# Patient Record
Sex: Male | Born: 1965 | Race: Black or African American | Hispanic: No | Marital: Single | Smoking: Never smoker
Health system: Southern US, Community
[De-identification: ages and names within clinical notes are randomized; demographics above are authoritative.]

## PROBLEM LIST (undated history)

## (undated) DIAGNOSIS — R51 Headache: Secondary | ICD-10-CM

---

## 2000-01-31 HISTORY — PX: VASECTOMY: SHX75

## 2001-03-28 ENCOUNTER — Encounter: Payer: Self-pay | Admitting: Emergency Medicine

## 2001-03-28 ENCOUNTER — Emergency Department (HOSPITAL_COMMUNITY): Admission: EM | Admit: 2001-03-28 | Discharge: 2001-03-29 | Payer: Self-pay | Admitting: Emergency Medicine

## 2006-01-17 ENCOUNTER — Encounter: Admission: RE | Admit: 2006-01-17 | Discharge: 2006-01-17 | Payer: Self-pay | Admitting: Family Medicine

## 2006-02-14 ENCOUNTER — Encounter: Admission: RE | Admit: 2006-02-14 | Discharge: 2006-02-14 | Payer: Self-pay | Admitting: Family Medicine

## 2008-09-29 ENCOUNTER — Encounter (HOSPITAL_COMMUNITY): Admission: RE | Admit: 2008-09-29 | Discharge: 2008-12-04 | Payer: Self-pay | Admitting: Cardiology

## 2009-10-05 ENCOUNTER — Emergency Department (HOSPITAL_COMMUNITY): Admission: EM | Admit: 2009-10-05 | Discharge: 2009-10-05 | Payer: Self-pay | Admitting: Emergency Medicine

## 2009-11-18 ENCOUNTER — Encounter: Admission: RE | Admit: 2009-11-18 | Discharge: 2009-11-18 | Payer: Self-pay | Admitting: Occupational Medicine

## 2009-11-22 ENCOUNTER — Encounter: Payer: Self-pay | Admitting: Family Medicine

## 2009-11-23 ENCOUNTER — Ambulatory Visit: Payer: Self-pay | Admitting: Family Medicine

## 2009-11-23 DIAGNOSIS — M7710 Lateral epicondylitis, unspecified elbow: Secondary | ICD-10-CM | POA: Insufficient documentation

## 2009-11-23 DIAGNOSIS — G90519 Complex regional pain syndrome I of unspecified upper limb: Secondary | ICD-10-CM | POA: Insufficient documentation

## 2009-12-14 ENCOUNTER — Ambulatory Visit: Payer: Self-pay | Admitting: Family Medicine

## 2009-12-14 DIAGNOSIS — S5420XA Injury of radial nerve at forearm level, unspecified arm, initial encounter: Secondary | ICD-10-CM | POA: Insufficient documentation

## 2010-01-11 ENCOUNTER — Ambulatory Visit: Payer: Self-pay | Admitting: Family Medicine

## 2010-02-21 ENCOUNTER — Encounter: Payer: Self-pay | Admitting: Cardiology
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
## 2010-03-01 NOTE — Letter (Signed)
Summary: [facility] [place] [facility] [place]   Imported By: Marily Memos 11/22/2009 12:29:19  _____________________________________________________________________  External Attachment:    Type:   Image     Comment:   External Document
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
## 2012-02-28 ENCOUNTER — Ambulatory Visit
Admission: RE | Admit: 2012-02-28 | Discharge: 2012-02-28 | Disposition: A | Discharge status: Home / Self Care | Payer: 59 | Source: Ambulatory Visit | Attending: Physician Assistant | Admitting: Physician Assistant

## 2012-02-28 ENCOUNTER — Other Ambulatory Visit: Payer: Self-pay | Admitting: Physician Assistant

## 2012-02-28 DIAGNOSIS — R51 Headache: Secondary | ICD-10-CM

## 2013-01-01 ENCOUNTER — Ambulatory Visit (INDEPENDENT_AMBULATORY_CARE_PROVIDER_SITE_OTHER): Payer: 59 | Admitting: General Surgery

## 2013-01-01 ENCOUNTER — Encounter (INDEPENDENT_AMBULATORY_CARE_PROVIDER_SITE_OTHER): Payer: Self-pay

## 2013-01-01 ENCOUNTER — Encounter (INDEPENDENT_AMBULATORY_CARE_PROVIDER_SITE_OTHER): Payer: Self-pay | Admitting: General Surgery

## 2013-01-01 ENCOUNTER — Telehealth (INDEPENDENT_AMBULATORY_CARE_PROVIDER_SITE_OTHER): Payer: Self-pay | Admitting: General Surgery

## 2013-01-01 VITALS — BP 132/84 | HR 80 | Temp 97.2°F | Resp 16 | Ht 72.0 in | Wt 190.0 lb

## 2013-01-01 DIAGNOSIS — K4090 Unilateral inguinal hernia, without obstruction or gangrene, not specified as recurrent: Secondary | ICD-10-CM

## 2013-01-01 NOTE — Progress Notes (Signed)
Patient ID: Charles Roy, male   DOB: 23-Nov-1965, 47 y.o.   MRN: [id number]  Chief Complaint  Patient presents with  . Inguinal Hernia    left    HPI Charles Roy is a 47 y.o. male.  The patient is a 47 year old male who is referred for evaluation of a left inguinal hernia. Patient states it has been there for approximately 8-6 months. The pain area. He does do some heavy lifting while at work.  HPI  No past medical history on file.  Past Surgical History  Procedure Laterality Date  . Vasectomy  2002    No family history on file.  Social History History  Substance Use Topics  . Smoking status: Never Smoker   . Smokeless tobacco: Not on file  . Alcohol Use: No    No Known Allergies  Current Outpatient Prescriptions  Medication Sig Dispense Refill  . ibuprofen (ADVIL,MOTRIN) 800 MG tablet Take 800 mg by mouth every 8 (eight) hours as needed.       No current facility-administered medications for this visit.    Review of Systems Review of Systems  Constitutional: Negative.   HENT: Negative.   Respiratory: Negative.   Cardiovascular: Negative.   Gastrointestinal: Negative.   Neurological: Negative.   All other systems reviewed and are negative.    Blood pressure 132/84, pulse 80, temperature 97.2 F (36.2 C), temperature source Temporal, resp. rate 16, height 6' (1.829 m), weight 190 lb (86.183 kg).  Physical Exam Physical Exam  Constitutional: He is oriented to person, place, and time. He appears well-developed and well-nourished.  HENT:  Head: Normocephalic and atraumatic.  Eyes: Conjunctivae and EOM are normal. Pupils are equal, round, and reactive to light.  Neck: Normal range of motion. Neck supple.  Cardiovascular: Normal rate, regular rhythm and normal heart sounds.   Pulmonary/Chest: Effort normal and breath sounds normal.  Abdominal: Soft. Bowel sounds are normal. A hernia is present. Hernia confirmed positive in the left inguinal area.  Hernia confirmed negative in the right inguinal area (Right inguinal weakness, no overt hernia).  Musculoskeletal: Normal range of motion.  Neurological: He is alert and oriented to person, place, and time.  Skin: Skin is warm.    Data Reviewed none  Assessment    47 year old male with a left inguinal hernia and right inguinal weakness.     Plan    1. We'll proceed to the operating room for laparoscopic leftinguinal hernia repair. We'll also review the right inguinal area with possible repair with mesh. 2. All risks and benefits were discussed with the patient, to generally include infection, bleeding, damage to surrounding structures, acute and chronic nerve pain, and recurrence. Alternatives were offered and described.  All questions were answered and the patient voiced understanding of the procedure and wishes to proceed at this point.         Marigene Ehlers., Lashe Oliveira 01/01/2013, 2:06 PM

## 2013-01-01 NOTE — Telephone Encounter (Signed)
01/01/13 I met with patient and went over benefits and financial responsibility. He will call back when he is ready to schedule. Placed in pending. skm

## 2013-02-19 ENCOUNTER — Other Ambulatory Visit (INDEPENDENT_AMBULATORY_CARE_PROVIDER_SITE_OTHER): Payer: Self-pay | Admitting: General Surgery

## 2013-02-21 ENCOUNTER — Encounter (HOSPITAL_COMMUNITY): Payer: Self-pay | Admitting: Pharmacy Technician

## 2013-02-26 NOTE — Pre-Procedure Instructions (Signed)
Charles OdorDouglas B Roy  02/26/2013   Your procedure is scheduled on:  Friday, February 28, 2013 at 12:15 PM  [facility] (use Main Entrance "A') at 10:15 AM.  Call this number if you have problems the morning of surgery: [phone]   Remember:   Do not eat food or drink liquids after midnight.   Take these medicines the morning of surgery with A SIP OF WATER: None Stop taking Aspirin and herbal medications. Do not take any NSAIDs ie: Ibuprofen, Advil, Naproxen or any medication containing Aspirin.  Do not wear jewelry, make-up or nail polish.  Do not wear lotions, powders, or perfumes. You may wear deodorant.  Do not shave 48 hours prior to surgery. Men may shave face and neck.  Do not bring valuables to the hospital.  [facility] is not responsible for any belongings or valuables.               Contacts, dentures or bridgework may not be worn into surgery.  Leave suitcase in the car. After surgery it may be brought to your room.  For patients admitted to the hospital, discharge time is determined by your treatment team.               Patients discharged the day of surgery will not be allowed to drive home.  Name and phone number of your driver:   Special Instructions: Shower using CHG the night before surgery and the day of surgery.   Please read over the following fact sheets that you were given: Pain Booklet, Coughing and Deep Breathing and Surgical Site Infection Prevention

## 2013-02-27 ENCOUNTER — Encounter (HOSPITAL_COMMUNITY)
Admission: RE | Admit: 2013-02-27 | Discharge: 2013-02-27 | Disposition: A | Discharge status: Home / Self Care | Payer: 59 | Source: Ambulatory Visit | Attending: General Surgery | Admitting: General Surgery

## 2013-02-27 ENCOUNTER — Encounter (HOSPITAL_COMMUNITY): Payer: Self-pay

## 2013-02-27 HISTORY — DX: Headache: R51

## 2013-02-27 LAB — BASIC METABOLIC PANEL
BUN: 10 mg/dL (ref 6–23)
CHLORIDE: 103 meq/L (ref 96–112)
CO2: 28 mEq/L (ref 19–32)
Calcium: 9.3 mg/dL (ref 8.4–10.5)
Creatinine, Ser: 0.99 mg/dL (ref 0.50–1.35)
GLUCOSE: 98 mg/dL (ref 70–99)
POTASSIUM: 4.6 meq/L (ref 3.7–5.3)
SODIUM: 142 meq/L (ref 137–147)

## 2013-02-27 LAB — CBC
HEMATOCRIT: 42.5 % (ref 39.0–52.0)
HEMOGLOBIN: 14.7 g/dL (ref 13.0–17.0)
MCH: 30.6 pg (ref 26.0–34.0)
MCHC: 34.6 g/dL (ref 30.0–36.0)
MCV: 88.4 fL (ref 78.0–100.0)
Platelets: 217 10*3/uL (ref 150–400)
RBC: 4.81 MIL/uL (ref 4.22–5.81)
RDW: 12.6 % (ref 11.5–15.5)
WBC: 5.2 10*3/uL (ref 4.0–10.5)

## 2013-02-27 MED ORDER — CHLORHEXIDINE GLUCONATE 4 % EX LIQD: 1.0000 "application " | Freq: Once | CUTANEOUS | Status: DC

## 2013-02-27 MED ORDER — CEFAZOLIN SODIUM-DEXTROSE 2-3 GM-% IV SOLR
2.0000 g | INTRAVENOUS | Status: AC
Start: 2013-02-28 — End: 2013-02-28
  Administered 2013-02-28: 2 g via INTRAVENOUS
  Filled 2013-02-27: qty 50

## 2013-02-28 ENCOUNTER — Encounter (HOSPITAL_COMMUNITY): Payer: Self-pay | Admitting: *Deleted

## 2013-02-28 ENCOUNTER — Ambulatory Visit (HOSPITAL_COMMUNITY): Payer: 59 | Admitting: Certified Registered Nurse Anesthetist

## 2013-02-28 ENCOUNTER — Encounter (HOSPITAL_COMMUNITY): Payer: 59 | Admitting: Certified Registered Nurse Anesthetist

## 2013-02-28 ENCOUNTER — Ambulatory Visit (HOSPITAL_COMMUNITY)
Admission: RE | Admit: 2013-02-28 | Discharge: 2013-02-28 | Disposition: A | Discharge status: Home / Self Care | Payer: 59 | Source: Ambulatory Visit | Attending: General Surgery | Admitting: General Surgery

## 2013-02-28 ENCOUNTER — Encounter (HOSPITAL_COMMUNITY)
Admission: RE | Disposition: A | Discharge status: Home / Self Care | Payer: Self-pay | Source: Ambulatory Visit | Attending: General Surgery

## 2013-02-28 DIAGNOSIS — K4020 Bilateral inguinal hernia, without obstruction or gangrene, not specified as recurrent: Secondary | ICD-10-CM | POA: Insufficient documentation

## 2013-02-28 HISTORY — PX: INSERTION OF MESH: SHX5868

## 2013-02-28 HISTORY — PX: INGUINAL HERNIA REPAIR: SHX194

## 2013-02-28 SURGERY — REPAIR, HERNIA, INGUINAL, BILATERAL, LAPAROSCOPIC
Anesthesia: General | Site: Groin

## 2013-02-28 MED ORDER — FENTANYL CITRATE 0.05 MG/ML IJ SOLN
INTRAMUSCULAR | Status: DC | PRN
  Administered 2013-02-28: 100 ug via INTRAVENOUS

## 2013-02-28 MED ORDER — ONDANSETRON HCL 4 MG/2ML IJ SOLN: 4.0000 mg | Freq: Once | INTRAMUSCULAR | Status: DC | PRN

## 2013-02-28 MED ORDER — ACETAMINOPHEN 160 MG/5ML PO SOLN
325.0000 mg | ORAL | Status: DC | PRN
  Filled 2013-02-28: qty 20.3

## 2013-02-28 MED ORDER — SODIUM CHLORIDE 0.9 % IJ SOLN: 3.0000 mL | INTRAMUSCULAR | Status: DC | PRN

## 2013-02-28 MED ORDER — GLYCOPYRROLATE 0.2 MG/ML IJ SOLN
INTRAMUSCULAR | Status: DC | PRN
  Administered 2013-02-28: 0.6 mg via INTRAVENOUS

## 2013-02-28 MED ORDER — OXYCODONE HCL 5 MG/5ML PO SOLN: 5.0000 mg | Freq: Once | ORAL | Status: DC | PRN

## 2013-02-28 MED ORDER — PROPOFOL 10 MG/ML IV BOLUS
INTRAVENOUS | Status: DC | PRN
  Administered 2013-02-28: 160 mg via INTRAVENOUS
  Administered 2013-02-28: 40 mg via INTRAVENOUS

## 2013-02-28 MED ORDER — ARTIFICIAL TEARS OP OINT
TOPICAL_OINTMENT | OPHTHALMIC | Status: AC
  Filled 2013-02-28: qty 3.5

## 2013-02-28 MED ORDER — ACETAMINOPHEN 325 MG PO TABS: 325.0000 mg | ORAL_TABLET | ORAL | Status: DC | PRN

## 2013-02-28 MED ORDER — OXYCODONE HCL 5 MG PO TABS
ORAL_TABLET | ORAL | Status: AC
  Filled 2013-02-28: qty 1

## 2013-02-28 MED ORDER — OXYCODONE HCL 5 MG PO TABS: 5.0000 mg | ORAL_TABLET | Freq: Once | ORAL | Status: DC | PRN

## 2013-02-28 MED ORDER — BUPIVACAINE HCL (PF) 0.25 % IJ SOLN
INTRAMUSCULAR | Status: DC | PRN
  Administered 2013-02-28: 5 mL

## 2013-02-28 MED ORDER — 0.9 % SODIUM CHLORIDE (POUR BTL) OPTIME
TOPICAL | Status: DC | PRN
  Administered 2013-02-28: 1000 mL

## 2013-02-28 MED ORDER — NEOSTIGMINE METHYLSULFATE 1 MG/ML IJ SOLN
INTRAMUSCULAR | Status: DC | PRN
  Administered 2013-02-28: 3 mg via INTRAVENOUS

## 2013-02-28 MED ORDER — FENTANYL CITRATE 0.05 MG/ML IJ SOLN
INTRAMUSCULAR | Status: AC
  Filled 2013-02-28: qty 5

## 2013-02-28 MED ORDER — GLYCOPYRROLATE 0.2 MG/ML IJ SOLN
INTRAMUSCULAR | Status: AC
  Filled 2013-02-28: qty 3

## 2013-02-28 MED ORDER — HYDROMORPHONE HCL PF 1 MG/ML IJ SOLN
0.2500 mg | INTRAMUSCULAR | Status: DC | PRN
  Administered 2013-02-28 (×2): 0.25 mg via INTRAVENOUS
  Administered 2013-02-28: 0.5 mg via INTRAVENOUS

## 2013-02-28 MED ORDER — LIDOCAINE HCL (CARDIAC) 20 MG/ML IV SOLN
INTRAVENOUS | Status: AC
  Filled 2013-02-28: qty 10

## 2013-02-28 MED ORDER — LIDOCAINE HCL (CARDIAC) 20 MG/ML IV SOLN
INTRAVENOUS | Status: DC | PRN
Start: 2013-02-28 — End: 2013-02-28
  Administered 2013-02-28: 80 mg via INTRAVENOUS

## 2013-02-28 MED ORDER — MIDAZOLAM HCL 2 MG/2ML IJ SOLN
INTRAMUSCULAR | Status: AC
  Filled 2013-02-28: qty 2

## 2013-02-28 MED ORDER — LACTATED RINGERS IV SOLN
INTRAVENOUS | Status: DC | PRN
  Administered 2013-02-28 (×2): via INTRAVENOUS

## 2013-02-28 MED ORDER — ACETAMINOPHEN 650 MG RE SUPP
650.0000 mg | RECTAL | Status: DC | PRN
  Filled 2013-02-28: qty 1

## 2013-02-28 MED ORDER — LACTATED RINGERS IV SOLN
Freq: Once | INTRAVENOUS | Status: AC
  Administered 2013-02-28: 11:00:00 via INTRAVENOUS

## 2013-02-28 MED ORDER — OXYCODONE-ACETAMINOPHEN 10-325 MG PO TABS: 1.0000 | ORAL_TABLET | ORAL | Status: AC | PRN

## 2013-02-28 MED ORDER — OXYCODONE HCL 5 MG PO TABS
5.0000 mg | ORAL_TABLET | ORAL | Status: DC | PRN
  Administered 2013-02-28: 5 mg via ORAL

## 2013-02-28 MED ORDER — HYDROMORPHONE HCL PF 1 MG/ML IJ SOLN
INTRAMUSCULAR | Status: AC
  Filled 2013-02-28: qty 1

## 2013-02-28 MED ORDER — ROCURONIUM BROMIDE 100 MG/10ML IV SOLN
INTRAVENOUS | Status: DC | PRN
  Administered 2013-02-28: 50 mg via INTRAVENOUS

## 2013-02-28 MED ORDER — SODIUM CHLORIDE 0.9 % IV SOLN
250.0000 mL | INTRAVENOUS | Status: DC | PRN
Start: 2013-02-28 — End: 2013-02-28

## 2013-02-28 MED ORDER — SODIUM CHLORIDE 0.9 % IJ SOLN: 3.0000 mL | Freq: Two times a day (BID) | INTRAMUSCULAR | Status: DC

## 2013-02-28 MED ORDER — PROPOFOL 10 MG/ML IV BOLUS
INTRAVENOUS | Status: AC
  Filled 2013-02-28: qty 20

## 2013-02-28 MED ORDER — ONDANSETRON HCL 4 MG/2ML IJ SOLN
4.0000 mg | Freq: Four times a day (QID) | INTRAMUSCULAR | Status: DC | PRN
  Filled 2013-02-28: qty 2

## 2013-02-28 MED ORDER — ACETAMINOPHEN 325 MG PO TABS
650.0000 mg | ORAL_TABLET | ORAL | Status: DC | PRN
  Filled 2013-02-28: qty 2

## 2013-02-28 MED ORDER — MIDAZOLAM HCL 5 MG/5ML IJ SOLN
INTRAMUSCULAR | Status: DC | PRN
  Administered 2013-02-28: 2 mg via INTRAVENOUS

## 2013-02-28 MED ORDER — ONDANSETRON HCL 4 MG/2ML IJ SOLN
INTRAMUSCULAR | Status: DC | PRN
  Administered 2013-02-28: 4 mg via INTRAVENOUS

## 2013-02-28 SURGICAL SUPPLY — 54 items
APL SKNCLS STERI-STRIP NONHPOA (GAUZE/BANDAGES/DRESSINGS) ×2
APPLIER CLIP LOGIC TI 5 (MISCELLANEOUS) IMPLANT
APR CLP MED LRG 33X5 (MISCELLANEOUS)
BENZOIN TINCTURE PRP APPL 2/3 (GAUZE/BANDAGES/DRESSINGS) ×3 IMPLANT
CANISTER SUCTION 2500CC (MISCELLANEOUS) IMPLANT
CHLORAPREP W/TINT 26ML (MISCELLANEOUS) ×3 IMPLANT
COVER SURGICAL LIGHT HANDLE (MISCELLANEOUS) ×3 IMPLANT
DEVICE TROCAR PUNCTURE CLOSURE (ENDOMECHANICALS) ×3 IMPLANT
DISSECT BALLN SPACEMKR + OVL (BALLOONS)
DISSECTOR BALLN SPACEMKR + OVL (BALLOONS) ×2 IMPLANT
DISSECTOR BLUNT TIP ENDO 5MM (MISCELLANEOUS) IMPLANT
DRAPE UTILITY 15X26 W/TAPE STR (DRAPE) ×6 IMPLANT
ELECT REM PT RETURN 9FT ADLT (ELECTROSURGICAL) ×3
ELECTRODE REM PT RTRN 9FT ADLT (ELECTROSURGICAL) ×2 IMPLANT
GAUZE SPONGE 2X2 8PLY STRL LF (GAUZE/BANDAGES/DRESSINGS) ×2 IMPLANT
GLOVE BIO SURGEON STRL SZ7.5 (GLOVE) ×3 IMPLANT
GLOVE BIOGEL PI IND STRL 6.5 (GLOVE) IMPLANT
GLOVE BIOGEL PI IND STRL 7.0 (GLOVE) IMPLANT
GLOVE BIOGEL PI INDICATOR 6.5 (GLOVE) ×2
GLOVE BIOGEL PI INDICATOR 7.0 (GLOVE) ×2
GLOVE SURG SS PI 7.0 STRL IVOR (GLOVE) ×2 IMPLANT
GOWN STRL NON-REIN LRG LVL3 (GOWN DISPOSABLE) ×6 IMPLANT
GOWN STRL REIN XL XLG (GOWN DISPOSABLE) ×3 IMPLANT
KIT BASIN OR (CUSTOM PROCEDURE TRAY) ×3 IMPLANT
KIT ROOM TURNOVER OR (KITS) ×3 IMPLANT
MESH 3DMAX LIGHT 4.1X6.2 LT LR (Mesh General) ×1 IMPLANT
MESH 3DMAX LIGHT 4.1X6.2 RT LR (Mesh General) ×1 IMPLANT
NDL INSUFFLATION 14GA 120MM (NEEDLE) ×2 IMPLANT
NEEDLE INSUFFLATION 14GA 120MM (NEEDLE) ×3 IMPLANT
NS IRRIG 1000ML POUR BTL (IV SOLUTION) ×3 IMPLANT
PAD ARMBOARD 7.5X6 YLW CONV (MISCELLANEOUS) ×6 IMPLANT
RELOAD STAPLE 4.0 BLU F/HERNIA (INSTRUMENTS) IMPLANT
RELOAD STAPLE 4.8 BLK F/HERNIA (STAPLE) IMPLANT
RELOAD STAPLE HERNIA 4.0 BLUE (INSTRUMENTS) ×3 IMPLANT
RELOAD STAPLE HERNIA 4.8 BLK (STAPLE) IMPLANT
SCISSORS LAP 5X35 DISP (ENDOMECHANICALS) ×3 IMPLANT
SET IRRIG TUBING LAPAROSCOPIC (IRRIGATION / IRRIGATOR) IMPLANT
SET TROCAR LAP APPLE-HUNT 5MM (ENDOMECHANICALS) ×1 IMPLANT
SLEEVE ENDOPATH XCEL 5M (ENDOMECHANICALS) ×2 IMPLANT
SPONGE GAUZE 2X2 STER 10/PKG (GAUZE/BANDAGES/DRESSINGS) ×1
STAPLER HERNIA 12 8.5 360D (INSTRUMENTS) ×3 IMPLANT
STRIP CLOSURE SKIN 1/2X4 (GAUZE/BANDAGES/DRESSINGS) ×1 IMPLANT
SUT MNCRL AB 4-0 PS2 18 (SUTURE) ×3 IMPLANT
SUT VIC AB 1 CT1 27 (SUTURE)
SUT VIC AB 1 CT1 27XBRD ANBCTR (SUTURE) ×2 IMPLANT
TAPE CLOTH SURG 4X10 WHT LF (GAUZE/BANDAGES/DRESSINGS) ×1 IMPLANT
TOWEL OR 17X24 6PK STRL BLUE (TOWEL DISPOSABLE) ×3 IMPLANT
TOWEL OR 17X26 10 PK STRL BLUE (TOWEL DISPOSABLE) ×3 IMPLANT
TRAY FOLEY CATH 14FR (SET/KITS/TRAYS/PACK) ×1 IMPLANT
TRAY FOLEY CATH 16FRSI W/METER (SET/KITS/TRAYS/PACK) ×2 IMPLANT
TRAY LAPAROSCOPIC (CUSTOM PROCEDURE TRAY) ×3 IMPLANT
TROCAR XCEL 12X100 BLDLESS (ENDOMECHANICALS) ×1 IMPLANT
TROCAR XCEL NON-BLD 11X100MML (ENDOMECHANICALS) ×2 IMPLANT
TROCAR XCEL NON-BLD 5MMX100MML (ENDOMECHANICALS) ×2 IMPLANT

## 2013-02-28 NOTE — Op Note (Signed)
02/28/2013  2:32 PM  PATIENT:  Charles Roy  48 y.o. male  PRE-OPERATIVE DIAGNOSIS:  BILATERAL INGUINAL HERNIA  POST-OPERATIVE DIAGNOSIS:  BILATERAL INGUINAL HERNIA  PROCEDURE:  Procedure(s): LAPAROSCOPIC BILATERAL INGUINAL HERNIA REPAIR (N/A) INSERTION OF MESH (Bilateral)  SURGEON:  Surgeon(s) and Role:    * Axel FillerArmando Annielee Jemmott, MD - Primary  ASSISTANTS: none   ANESTHESIA:   general  EBL:  Total I/O In: 1000 [I.V.:1000] Out: -   BLOOD ADMINISTERED:none  DRAINS: none   LOCAL MEDICATIONS USED:  MARCAINE     SPECIMEN:  No Specimen  DISPOSITION OF SPECIMEN:  N/A  COUNTS:  YES  TOURNIQUET:  * No tourniquets in log *  DICTATION: .Dragon Dictation   Indications for procedure:  The patient is a 48 year old male with a left inguinal hernia for several months. Patient complained of symptomatology to his left inguinal area.  He also had some inguinal floor weakness on the right. The patient was taken back for elective inguinal hernia repair.  Details of the procedure: The patient was taken back to the operating room. The patient was placed in supine position with bilateral SCDs in place. After appropriate anitbiotics were confirmed, a time-out was confirmed and all facts were verified.  0.25% Marcaine was used to infiltrate the umbilical area. He was used to cut down the skin and blunt dissection was used to get the anterior fashion.  The anterior fascia was incised approximately 1 cm and the muscles were divided anteriorly. Blunt dissection was then used to create a space in the preperitoneal area. At this time a 10 mm camera was then introduced into the space and advanced the pubic tubercle and a 12 mm trocar was placed over this and insufflation was started.  At this time and space was created from medial to laterally the preperitoneal space. The Left hernia sac was identified in the indirect space. Dissection of the hernia sac was undertaken the vas deferens was identified and  protected in all parts of the case.   Once the hernia sac was taken down to approximately the umbilicus a Bard 3D light left mesh was introduced into the preperitoneal space.  The mesh was brought over the hernia space defect and anchored into place and secured to Cooper's ligament with 4.510mm staples from a Coviden hernia stapler. It was anchored to the anterior abdominal wall with 4.8 mm staples. The hernia sac was seen lying anterior to the mesh. There was no staples placed laterally.   I proceeded to dissect the right side inguinal area.  I made a hole in the right sided peritoneum.   A Veress needle right upper quadrant to help evacuate the intraperitoneal air.  I proceeded to dissect out the right testicular vessels and hernia sac on the right.  The vas was seen and protected.  The exact dissection of the right side preperitoneal space took place.  A piece of Left 3D max light mesh was then placed and secured in the exact same fashion using the Covidien hernia stapler.   The insufflation was evacuated. The trochars were removed. The anterior fascia was reapproximated using #1 Vicryl on a UR- 6.  Intra-abdominal air was evacuated and the Veress needle removed. The skin was reapproximated using 4-0 Monocryl subcuticular fashion the patient was awakened from general anesthesia and taken to recovery in stable condition.   PLAN OF CARE: Discharge to home after PACU  PATIENT DISPOSITION:  PACU - hemodynamically stable.   Delay start of Pharmacological VTE agent (>24hrs)  due to surgical blood loss or risk of bleeding: not applicable

## 2013-02-28 NOTE — Transfer of Care (Signed)
Immediate Anesthesia Transfer of Care Note  Patient: Charles Roy  Procedure(s) Performed: Procedure(s): LAPAROSCOPIC BILATERAL INGUINAL HERNIA REPAIR (N/A) INSERTION OF MESH (Bilateral)  Patient Location: PACU  Anesthesia Type:General  Level of Consciousness: awake, alert , oriented and patient cooperative  Airway & Oxygen Therapy: Patient Spontanous Breathing and Patient connected to nasal cannula oxygen  Post-op Assessment: Report given to PACU RN, Post -op Vital signs reviewed and stable and Patient moving all extremities X 4  Post vital signs: Reviewed and stable  Complications: No apparent anesthesia complications

## 2013-02-28 NOTE — H&P (Signed)
  HPI  Charles Roy is a 48 y.o. male. The patient is a 48 year old male who is referred for evaluation of a left inguinal hernia. Patient states it has been there for approximately 8-6 months. The pain area. He does do some heavy lifting while at work.  HPI  No past medical history on file.  Past Surgical History   Procedure  Laterality  Date   .  Vasectomy   2002   No family history on file.  Social History  History   Substance Use Topics   .  Smoking status:  Never Smoker   .  Smokeless tobacco:  Not on file   .  Alcohol Use:  No   No Known Allergies  Current Outpatient Prescriptions   Medication  Sig  Dispense  Refill   .  ibuprofen (ADVIL,MOTRIN) 800 MG tablet  Take 800 mg by mouth every 8 (eight) hours as needed.      No current facility-administered medications for this visit.   Review of Systems  Review of Systems  Constitutional: Negative.  HENT: Negative.  Respiratory: Negative.  Cardiovascular: Negative.  Gastrointestinal: Negative.  Neurological: Negative.  All other systems reviewed and are negative.  Blood pressure 132/84, pulse 80, temperature 97.2 F (36.2 C), temperature source Temporal, resp. rate 16, height 6' (1.829 m), weight 190 lb (86.183 kg).  Physical Exam  Physical Exam  Constitutional: He is oriented to person, place, and time. He appears well-developed and well-nourished.  HENT:  Head: Normocephalic and atraumatic.  Eyes: Conjunctivae and EOM are normal. Pupils are equal, round, and reactive to light.  Neck: Normal range of motion. Neck supple.  Cardiovascular: Normal rate, regular rhythm and normal heart sounds.  Pulmonary/Chest: Effort normal and breath sounds normal.  Abdominal: Soft. Bowel sounds are normal. A hernia is present. Hernia confirmed positive in the left inguinal area. Hernia confirmed negative in the right inguinal area (Right inguinal weakness, no overt hernia).  Musculoskeletal: Normal range of motion.  Neurological: He is  alert and oriented to person, place, and time.  Skin: Skin is warm.  Data Reviewed  none  Assessment  48 year old male with a left inguinal hernia and right inguinal weakness.  Plan  1. We'll proceed to the operating room for laparoscopic leftinguinal hernia repair. We'll also review the right inguinal area with possible repair with mesh.  2. All risks and benefits were discussed with the patient, to generally include infection, bleeding, damage to surrounding structures, acute and chronic nerve pain, and recurrence. Alternatives were offered and described. All questions were answered and the patient voiced understanding of the procedure and wishes to proceed at this point.

## 2013-02-28 NOTE — Preoperative (Signed)
Beta Blockers   Reason not to administer Beta Blockers:Not Applicable 

## 2013-02-28 NOTE — Anesthesia Procedure Notes (Signed)
Procedure Name: Intubation Date/Time: 02/28/2013 12:59 PM Performed by: De NurseENNIE, Jazzlyn Huizenga E Pre-anesthesia Checklist: Patient identified, Emergency Drugs available, Suction available, Patient being monitored and Timeout performed Patient Re-evaluated:Patient Re-evaluated prior to inductionOxygen Delivery Method: Circle system utilized Preoxygenation: Pre-oxygenation with 100% oxygen Intubation Type: IV induction Ventilation: Mask ventilation without difficulty Laryngoscope Size: Mac and 4 Grade View: Grade II Tube type: Oral Tube size: 7.5 mm Number of attempts: 1 Airway Equipment and Method: Stylet Placement Confirmation: ETT inserted through vocal cords under direct vision,  positive ETCO2 and breath sounds checked- equal and bilateral Secured at: 21 cm Tube secured with: Tape Dental Injury: Teeth and Oropharynx as per pre-operative assessment

## 2013-02-28 NOTE — Discharge Instructions (Signed)
Inguinal Hernia, Adult  Care After Refer to this sheet in the next few weeks. These discharge instructions provide you with general information on caring for yourself after you leave the hospital. Your caregiver may also give you specific instructions. Your treatment has been planned according to the most current medical practices available, but unavoidable complications sometimes occur. If you have any problems or questions after discharge, please call your caregiver. HOME CARE INSTRUCTIONS  Put ice on the operative site.  Put ice in a plastic bag.  Place a towel between your skin and the bag.  Leave the ice on for 15-20 minutes at a time, 03-04 times a day while awake.  Change bandages (dressings) as directed.  Keep the wound dry and clean. The wound may be washed gently with soap and water. Gently blot or dab the wound dry. It is okay to take showers 24 to 48 hours after surgery. Do not take baths, use swimming pools, or use hot tubs for 10 days, or as directed by your caregiver.  Only take over-the-counter or prescription medicines for pain, discomfort, or fever as directed by your caregiver.  Continue your normal diet as directed.  Do not lift anything more than 10 pounds or play contact sports for 3 weeks, or as directed. SEEK MEDICAL CARE IF:  There is redness, swelling, or increasing pain in the wound.  There is fluid (pus) coming from the wound.  There is drainage from a wound lasting longer than 1 day.  You have an oral temperature above 102 F (38.9 C).  You notice a bad smell coming from the wound or dressing.  The wound breaks open after the stitches (sutures) have been removed.  You notice increasing pain in the shoulders (shoulder strap areas).  You develop dizzy episodes or fainting while standing.  You feel sick to your stomach (nauseous) or throw up (vomit). SEEK IMMEDIATE MEDICAL CARE IF:  You develop a rash.  You have difficulty breathing.  You  develop a reaction or have side effects to medicines you were given. MAKE SURE YOU:   Understand these instructions.  Will watch your condition.  Will get help right away if you are not doing well or get worse. Document Released: 02/16/2006 Document Revised: 04/10/2011 Document Reviewed: 12/16/2008 [facility] Patient Information 2014 [place], [place]   What to eat:  For your first meals, you should eat lightly; only small meals initially.  If you do not have nausea, you may eat larger meals.  Avoid spicy, greasy and heavy food.    General Anesthesia, Adult, Care After  Refer to this sheet in the next few weeks. These instructions provide you with information on caring for yourself after your procedure. Your health care provider may also give you more specific instructions. Your treatment has been planned according to current medical practices, but problems sometimes occur. Call your health care provider if you have any problems or questions after your procedure.  WHAT TO EXPECT AFTER THE PROCEDURE  After the procedure, it is typical to experience:  Sleepiness.  Nausea and vomiting. HOME CARE INSTRUCTIONS  For the first 24 hours after general anesthesia:  Have a responsible person with you.  Do not drive a car. If you are alone, do not take public transportation.  Do not drink alcohol.  Do not take medicine that has not been prescribed by your health care provider.  Do not sign important papers or make important decisions.  You may resume a normal diet and activities as  directed by your health care provider.  Change bandages (dressings) as directed.  If you have questions or problems that seem related to general anesthesia, call the hospital and ask for the anesthetist or anesthesiologist on call. SEEK MEDICAL CARE IF:  You have nausea and vomiting that continue the day after anesthesia.  You develop a rash. SEEK IMMEDIATE MEDICAL CARE IF:  You have difficulty breathing.  You have  chest pain.  You have any allergic problems. Document Released: 04/24/2000 Document Revised: 09/18/2012 Document Reviewed: 08/01/2012  [facility] Patient Information 2014 [place], [place].

## 2013-02-28 NOTE — Anesthesia Postprocedure Evaluation (Signed)
Anesthesia Post Note  Patient: Charles Roy  Procedure(s) Performed: Procedure(s) (LRB): LAPAROSCOPIC BILATERAL INGUINAL HERNIA REPAIR (N/A) INSERTION OF MESH (Bilateral)  Anesthesia type: general  Patient location: PACU  Post pain: Pain level controlled  Post assessment: Patient's Cardiovascular Status Stable  Last Vitals:  Filed Vitals:   02/28/13 1600  BP: 130/69  Pulse: 98  Temp:   Resp: 21    Post vital signs: Reviewed and stable  Level of consciousness: sedated  Complications: No apparent anesthesia complications
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
## 2013-03-01 ENCOUNTER — Telehealth (INDEPENDENT_AMBULATORY_CARE_PROVIDER_SITE_OTHER): Payer: Self-pay | Admitting: General Surgery

## 2013-03-01 MED ORDER — IBUPROFEN 800 MG PO TABS: 800.0000 mg | ORAL_TABLET | Freq: Three times a day (TID) | ORAL | Status: AC | PRN

## 2013-03-01 MED ORDER — PROMETHAZINE HCL 12.5 MG PO TABS: ORAL_TABLET | ORAL | Status: AC

## 2013-03-01 NOTE — Telephone Encounter (Signed)
Pt having nausea and vomiting with percocet.  Sending in phenergan to pharmacy and ibuprofen.

## 2013-03-01 NOTE — Telephone Encounter (Signed)
Received message to call back.   Got message "number not available."

## 2013-03-03 ENCOUNTER — Encounter (HOSPITAL_COMMUNITY): Payer: Self-pay | Admitting: General Surgery

## 2013-03-20 ENCOUNTER — Encounter (INDEPENDENT_AMBULATORY_CARE_PROVIDER_SITE_OTHER): Payer: Self-pay | Admitting: General Surgery

## 2013-03-20 ENCOUNTER — Ambulatory Visit (INDEPENDENT_AMBULATORY_CARE_PROVIDER_SITE_OTHER): Payer: 59 | Admitting: General Surgery

## 2013-03-20 VITALS — BP 122/68 | HR 70 | Resp 16 | Ht 72.0 in | Wt 192.0 lb

## 2013-03-20 DIAGNOSIS — Z9889 Other specified postprocedural states: Secondary | ICD-10-CM

## 2013-03-20 NOTE — Progress Notes (Signed)
Patient ID: Charles Roy, male   DOB: 14-Nov-1965, 48 y.o.   MRN: [id number] Post op course The patient is a 48 year old male status post laparoscopic bilateral inguinal hernia repair with mesh. Patient had some pain to his left inguinal area superior abdominal wall. He states a sharp pain. His ongoing approximately 4 or 5 days ago. Prior to this using the issues. The patient's had no issues with his right inguinal hernia repair or solution the incision sites.  On Exam: Is midline wounds are clean dry and intact there is no hernia on palpation on the left or right. He does have a small palpable knot on his left abdominal wall.   Assessment and Plan 48 year old male status post laparoscopic bilateral inguinal hernia repair with mesh. 1. We'll have the patient follow back up in 2 weeks 2. Discussed with the patient a heavy lifting for another 3 weeks. 3. Because the patient would likely be beneficial to take ibuprofen help with some of the pain is having.   Charles FillerArmando Roel Douthat, MD [facility] General & Minimally Invasive Surgery Trauma & Emergency Surgery

## 2013-04-17 ENCOUNTER — Encounter (INDEPENDENT_AMBULATORY_CARE_PROVIDER_SITE_OTHER): Payer: Self-pay | Admitting: General Surgery

## 2013-04-17 ENCOUNTER — Encounter (INDEPENDENT_AMBULATORY_CARE_PROVIDER_SITE_OTHER): Payer: Self-pay

## 2013-04-17 ENCOUNTER — Ambulatory Visit (INDEPENDENT_AMBULATORY_CARE_PROVIDER_SITE_OTHER): Payer: 59 | Admitting: General Surgery

## 2013-04-17 VITALS — BP 140/78 | HR 75 | Temp 97.8°F | Resp 16 | Ht 72.0 in | Wt 197.0 lb

## 2013-04-17 DIAGNOSIS — Z9889 Other specified postprocedural states: Secondary | ICD-10-CM

## 2013-04-17 NOTE — Progress Notes (Signed)
Patient ID: Charles Roy, male   DOB: 1965-08-01, 48 y.o.   MRN: [id number] Post op course The patient has been doing well since his last clinic visit. He states that he not he felt his left abdominal wall area has resolved. He feels some minor stretching his midline however is getting better. The patient otherwise has no further complaints  On Exam: Wounds are clean dry and intact, no hernia on palpation   Assessment and Plan 48 year old male status post laparoscopic bilateral inguinal hernia repairwith mesh 1. The patient returned to work, no heavy lifting greater then 50 pounds months. 2. The patient followup as needed   Charles FillerArmando Hansini Clodfelter, MD [facility] General & Minimally Invasive Surgery Trauma & Emergency Surgery

## 2013-07-07 ENCOUNTER — Telehealth (INDEPENDENT_AMBULATORY_CARE_PROVIDER_SITE_OTHER): Payer: Self-pay

## 2013-07-07 NOTE — Telephone Encounter (Signed)
Patient is requesting a letter for daughter who is the [employer] stating she was LOA from 02/28/13 to 04/17/13 to take care of her dad after surgery. Advised him I would need to get it cleared thru  DR. Derrell Lolling and I or Neysa Bonito would call him to let him know of his response.

## 2013-07-08 NOTE — Telephone Encounter (Signed)
I would think that 2weeks max would account for care after surgery.

## 2013-07-10 ENCOUNTER — Encounter (INDEPENDENT_AMBULATORY_CARE_PROVIDER_SITE_OTHER): Payer: Self-pay

## 2013-07-10 NOTE — Telephone Encounter (Signed)
Called and spoke to patient regarding note for daughter while patient was recovery from surgery.  Note written, signed by Dr. Derrell Lolling and mailed to patient per his request.

## 2014-05-30 ENCOUNTER — Emergency Department (HOSPITAL_COMMUNITY)
Admission: EM | Admit: 2014-05-30 | Discharge: 2014-05-30 | Disposition: A | Discharge status: Home / Self Care | Payer: 59 | Attending: Emergency Medicine | Admitting: Emergency Medicine

## 2014-05-30 ENCOUNTER — Encounter (HOSPITAL_COMMUNITY): Payer: Self-pay

## 2014-05-30 DIAGNOSIS — Y998 Other external cause status: Secondary | ICD-10-CM | POA: Diagnosis not present

## 2014-05-30 DIAGNOSIS — Y9389 Activity, other specified: Secondary | ICD-10-CM | POA: Insufficient documentation

## 2014-05-30 DIAGNOSIS — Y9289 Other specified places as the place of occurrence of the external cause: Secondary | ICD-10-CM | POA: Insufficient documentation

## 2014-05-30 DIAGNOSIS — Z8679 Personal history of other diseases of the circulatory system: Secondary | ICD-10-CM | POA: Insufficient documentation

## 2014-05-30 DIAGNOSIS — S60352A Superficial foreign body of left thumb, initial encounter: Secondary | ICD-10-CM | POA: Diagnosis present

## 2014-05-30 DIAGNOSIS — X58XXXA Exposure to other specified factors, initial encounter: Secondary | ICD-10-CM | POA: Insufficient documentation

## 2014-05-30 DIAGNOSIS — Z23 Encounter for immunization: Secondary | ICD-10-CM | POA: Diagnosis not present

## 2014-05-30 DIAGNOSIS — M795 Residual foreign body in soft tissue: Secondary | ICD-10-CM

## 2014-05-30 MED ORDER — TETANUS-DIPHTH-ACELL PERTUSSIS 5-2.5-18.5 LF-MCG/0.5 IM SUSP
0.5000 mL | Freq: Once | INTRAMUSCULAR | Status: AC
  Administered 2014-05-30: 0.5 mL via INTRAMUSCULAR
  Filled 2014-05-30: qty 0.5

## 2014-05-30 MED ORDER — LIDOCAINE HCL (PF) 1 % IJ SOLN
2.0000 mL | Freq: Once | INTRAMUSCULAR | Status: DC
  Filled 2014-05-30: qty 5

## 2014-05-30 NOTE — Discharge Instructions (Signed)
Watch for signs of infection

## 2014-05-30 NOTE — ED Notes (Signed)
Pt presents with c/o foreign object in his thumb. Pt has a fishing lure stuck in his thumb, past the barb.

## 2014-05-30 NOTE — ED Provider Notes (Signed)
CSN: [id number]     Arrival date & time 05/30/14  2029 History  This chart was scribed for Earley FavorGail Latona Krichbaum, NP working with Azalia BilisKevin Campos, MD by Evon Slackerrance Branch, ED Scribe. This patient was seen in room WTR2/WLPT2 and the patient's care was started at 8:40 PM.     Chief Complaint  Patient presents with  . Foreign Body in Skin   The history is provided by the patient. No language interpreter was used.   HPI Comments: Charles Roy is a 49 y.o. male who presents to the Emergency Department complaining of new foreign body in the left thumb. Pt states that he has a fishing lure in his thumb. Pt states that he tried to remove the lure with no relief. Pt denies numbness or tingling. Pt sates that he is unsure if his tetanus is UTD.   Past Medical History  Diagnosis Date  . Headache(784.0)     migraines 1 years ago   Past Surgical History  Procedure Laterality Date  . Vasectomy  2002  . Inguinal hernia repair N/A 02/28/2013    Procedure: LAPAROSCOPIC BILATERAL INGUINAL HERNIA REPAIR;  Surgeon: Axel FillerArmando Ramirez, MD;  Location: [facility] OR;  Service: General;  Laterality: N/A;  . Insertion of mesh Bilateral 02/28/2013    Procedure: INSERTION OF MESH;  Surgeon: Axel FillerArmando Ramirez, MD;  Location: [facility] OR;  Service: General;  Laterality: Bilateral;   No family history on file. History  Substance Use Topics  . Smoking status: Never Smoker   . Smokeless tobacco: Never Used  . Alcohol Use: No    Review of Systems  Skin: Positive for wound.  All other systems reviewed and are negative.     Allergies  Pork-derived products  Home Medications   Prior to Admission medications   Medication Sig Start Date End Date Taking? Authorizing Provider  ibuprofen (ADVIL,MOTRIN) 800 MG tablet Take 1 tablet (800 mg total) by mouth every 8 (eight) hours as needed for moderate pain. 03/01/13   Almond LintFaera Byerly, MD  oxyCODONE-acetaminophen (PERCOCET) 10-325 MG per tablet Take 1 tablet by mouth every 4 (four) hours as needed  for pain. 02/28/13   Axel FillerArmando Ramirez, MD  promethazine (PHENERGAN) 12.5 MG tablet Take 1-2 tabs po q 6 hours as needed for n/v 03/01/13   Almond LintFaera Byerly, MD   BP 152/94 mmHg  Pulse 66  Temp(Src) 98 F (36.7 C) (Oral)  Resp 18  SpO2 100%   Physical Exam  Constitutional: He is oriented to person, place, and time. He appears well-developed and well-nourished. No distress.  HENT:  Head: Normocephalic and atraumatic.  Eyes: Conjunctivae and EOM are normal.  Neck: Neck supple. No tracheal deviation present.  Cardiovascular: Normal rate.   Pulmonary/Chest: Effort normal. No respiratory distress.  Musculoskeletal: Normal range of motion.  Neurological: He is alert and oriented to person, place, and time.  Skin: Skin is warm and dry.  Psychiatric: He has a normal mood and affect. His behavior is normal.  Nursing note and vitals reviewed.   ED Course  FOREIGN BODY REMOVAL Date/Time: 05/30/2014 9:37 PM Performed by: Earley FavorSCHULZ, Julianah Marciel Authorized by: Earley FavorSCHULZ, Khari Lett Consent: Written consent not obtained. Consent given by: patient Patient understanding: patient states understanding of the procedure being performed Patient identity confirmed: verbally with patient Body area: skin General location: upper extremity Location details: left index finger Anesthesia: local infiltration Local anesthetic: lidocaine 1% without epinephrine Anesthetic total: 0.2 ml Patient sedated: no Patient restrained: no Patient cooperative: yes Localization method: visualized Removal mechanism: forceps Tendon  involvement: none Depth: subcutaneous Complexity: simple 1 objects recovered. Objects recovered: fish hook Post-procedure assessment: foreign body removed Patient tolerance: Patient tolerated the procedure well with no immediate complications Comments: Finger soaking in Betadine solution    (including critical care time) DIAGNOSTIC STUDIES: Oxygen Saturation is 100% on RA, normal by my interpretation.     COORDINATION OF CARE: 8:43 PM-Discussed treatment plan with pt at bedside and pt agreed to plan.     Labs Review Labs Reviewed - No data to display  Imaging Review No results found.   EKG Interpretation None      MDM   Final diagnoses:  Foreign body (FB) in soft tissue           Earley Favor, NP 05/30/14 2139  Azalia Bilis, MD 05/30/14 [phone]

## 2015-07-07 ENCOUNTER — Other Ambulatory Visit: Payer: Self-pay | Admitting: Physician Assistant

## 2015-07-07 DIAGNOSIS — R1011 Right upper quadrant pain: Secondary | ICD-10-CM

## 2015-07-15 ENCOUNTER — Ambulatory Visit
Admission: RE | Admit: 2015-07-15 | Discharge: 2015-07-15 | Disposition: A | Discharge status: Home / Self Care | Payer: Commercial Managed Care - HMO | Source: Ambulatory Visit | Attending: Physician Assistant | Admitting: Physician Assistant

## 2015-07-15 DIAGNOSIS — R1011 Right upper quadrant pain: Secondary | ICD-10-CM

## 2016-03-01 DIAGNOSIS — M25521 Pain in right elbow: Secondary | ICD-10-CM | POA: Diagnosis not present

## 2016-03-13 DIAGNOSIS — M25521 Pain in right elbow: Secondary | ICD-10-CM | POA: Diagnosis not present

## 2016-05-01 DIAGNOSIS — M25521 Pain in right elbow: Secondary | ICD-10-CM | POA: Diagnosis not present

## 2016-09-20 DIAGNOSIS — Z0000 Encounter for general adult medical examination without abnormal findings: Secondary | ICD-10-CM | POA: Diagnosis not present

## 2016-09-20 DIAGNOSIS — Z13220 Encounter for screening for lipoid disorders: Secondary | ICD-10-CM | POA: Diagnosis not present

## 2017-09-26 DIAGNOSIS — Z0000 Encounter for general adult medical examination without abnormal findings: Secondary | ICD-10-CM | POA: Diagnosis not present

## 2017-09-26 DIAGNOSIS — Z8249 Family history of ischemic heart disease and other diseases of the circulatory system: Secondary | ICD-10-CM | POA: Diagnosis not present

## 2017-09-26 DIAGNOSIS — Z13220 Encounter for screening for lipoid disorders: Secondary | ICD-10-CM | POA: Diagnosis not present

## 2018-01-23 ENCOUNTER — Emergency Department (HOSPITAL_COMMUNITY): Payer: 59

## 2018-01-23 ENCOUNTER — Other Ambulatory Visit: Payer: Self-pay

## 2018-01-23 ENCOUNTER — Emergency Department (HOSPITAL_COMMUNITY)
Admission: EM | Admit: 2018-01-23 | Discharge: 2018-01-23 | Disposition: A | Discharge status: Home / Self Care | Payer: 59 | Attending: Emergency Medicine | Admitting: Emergency Medicine

## 2018-01-23 ENCOUNTER — Encounter (HOSPITAL_COMMUNITY): Payer: Self-pay

## 2018-01-23 DIAGNOSIS — R05 Cough: Secondary | ICD-10-CM | POA: Diagnosis not present

## 2018-01-23 DIAGNOSIS — J111 Influenza due to unidentified influenza virus with other respiratory manifestations: Secondary | ICD-10-CM | POA: Insufficient documentation

## 2018-01-23 DIAGNOSIS — R509 Fever, unspecified: Secondary | ICD-10-CM | POA: Diagnosis not present

## 2018-01-23 LAB — INFLUENZA PANEL BY PCR (TYPE A & B)
Influenza A By PCR: POSITIVE — AB
Influenza B By PCR: NEGATIVE

## 2018-01-23 MED ORDER — ACETAMINOPHEN 500 MG PO TABS
1000.0000 mg | ORAL_TABLET | Freq: Once | ORAL | Status: AC
  Administered 2018-01-23: 1000 mg via ORAL
  Filled 2018-01-23: qty 2

## 2018-01-23 MED ORDER — OSELTAMIVIR PHOSPHATE 75 MG PO CAPS
75.0000 mg | ORAL_CAPSULE | Freq: Once | ORAL | Status: AC
  Administered 2018-01-23: 75 mg via ORAL
  Filled 2018-01-23: qty 1

## 2018-01-23 MED ORDER — OSELTAMIVIR PHOSPHATE 75 MG PO CAPS: 75.0000 mg | ORAL_CAPSULE | Freq: Two times a day (BID) | ORAL | 0 refills | Status: AC

## 2018-01-23 NOTE — Discharge Instructions (Signed)
As discussed, your evaluation today has been largely reassuring (aside from the influenza positive test).  But, it is important that you monitor your condition carefully, and do not hesitate to return to the ED if you develop new, or concerning changes in your condition.  Otherwise, please follow-up with your physician for appropriate ongoing care.

## 2018-01-23 NOTE — ED Provider Notes (Signed)
[facility]-EMERGENCY DEPT Provider Note   CSN: [id number] Arrival date & time: 01/23/18  1542     History   Chief Complaint Chief Complaint  Patient presents with  . Fever    HPI Charles Roy is a 52 y.o. male.  HPI Patient presents with concern of fever, cough, headache. Patient has a history of migraines, has developed headache over the past 2 days as he has had a persistent fever, with only intermittent improvement with ibuprofen There is associated cough. Headache is worse with coughing, headache is diffuse, sore. No vision changes, no confusion, disorientation, generalized weakness. No relief with OTC medication for his cough either. Patient did not get his influenza vaccine this year. 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  Patient does not smoke, does not drink. Past Medical History:  Diagnosis Date  . Headache(784.0)    migraines 1 years ago    Patient Active Problem List   Diagnosis Date Noted  . 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 LATERAL EPICONDYLITIS, RIGHT 11/23/2009    Past Surgical History:  Procedure Laterality Date  . INGUINAL HERNIA REPAIR N/A 02/28/2013   Procedure: LAPAROSCOPIC BILATERAL INGUINAL HERNIA REPAIR;  Surgeon: Axel FillerArmando Ramirez, MD;  Location: [facility] OR;  Service: General;  Laterality: N/A;  . INSERTION OF MESH Bilateral 02/28/2013   Procedure: INSERTION OF MESH;  Surgeon: Axel FillerArmando Ramirez, MD;  Location: [facility] OR;  Service: General;  Laterality: Bilateral;  . VASECTOMY  2002        Home Medications    Prior to Admission medications   Medication Sig Start Date End Date Taking? Authorizing Provider  guaiFENesin (ROBITUSSIN) 100 MG/5ML liquid Take 200 mg by mouth 3 (three) times daily as needed for cough.   Yes [provider]  ibuprofen (ADVIL,MOTRIN) 800 MG tablet Take 800 mg by mouth every 8 (eight) hours as needed for  moderate pain (migraine).   Yes [provider]  pseudoephedrine (SUDAFED) 60 MG tablet Take 60 mg by mouth every 4 (four) hours as needed for congestion.   Yes [provider]  ibuprofen (ADVIL,MOTRIN) 800 MG tablet Take 1 tablet (800 mg total) by mouth every 8 (eight) hours as needed for moderate pain. Patient not taking: Reported on 01/23/2018 03/01/13   Almond LintByerly, Faera, MD  oseltamivir (TAMIFLU) 75 MG capsule Take 1 capsule (75 mg total) by mouth every 12 (twelve) hours. 01/23/18   Gerhard MunchLockwood, Chauntay Paszkiewicz, MD  oxyCODONE-acetaminophen (PERCOCET) 10-325 MG per tablet Take 1 tablet by mouth every 4 (four) hours as needed for pain. Patient not taking: Reported on 01/23/2018 02/28/13   Axel Filleramirez, Armando, MD  promethazine (PHENERGAN) 12.5 MG tablet Take 1-2 tabs po q 6 hours as needed for n/v Patient not taking: Reported on 01/23/2018 03/01/13   Almond LintByerly, Faera, MD    Family History No family history on file.  Social History Social History   Tobacco Use  . Smoking status: Never Smoker  . Smokeless tobacco: Never Used  Substance Use Topics  . Alcohol use: No  . Drug use: No     Allergies   Pork-derived products   Review of Systems Review of Systems  Constitutional:       Per HPI, otherwise negative  HENT:       Per HPI, otherwise negative  Respiratory:       Per HPI, otherwise negative  Cardiovascular:       Per HPI, otherwise negative  Gastrointestinal: Negative for vomiting.  Endocrine:       Negative aside from HPI  Genitourinary:       Neg aside from HPI   Musculoskeletal:       Per HPI, otherwise negative  Skin: Negative.   Neurological: Positive for headaches. Negative for syncope.     Physical Exam Updated Vital Signs BP (!) 146/84   Pulse (!) 106   Temp (!) 101.1 F (38.4 C) (Oral)   Resp 17   Ht 6' (1.829 m)   Wt 88 kg   SpO2 97%   BMI 26.31 kg/m   Physical Exam Vitals signs and nursing note reviewed.  Constitutional:      General: He is  not in acute distress.    Appearance: He is well-developed.  HENT:     Head: Normocephalic and atraumatic.  Eyes:     Conjunctiva/sclera: Conjunctivae normal.  Cardiovascular:     Rate and Rhythm: Normal rate and regular rhythm.  Pulmonary:     Effort: Pulmonary effort is normal. No respiratory distress.     Breath sounds: No stridor.  Abdominal:     General: There is no distension.  Skin:    General: Skin is warm and dry.  Neurological:     Mental Status: He is alert and oriented to person, place, and time.      ED Treatments / Results  Labs (all labs ordered are listed, but only abnormal results are displayed) Labs Reviewed  INFLUENZA PANEL BY PCR (TYPE A & B) - Abnormal; Notable for the following components:      Result Value   Influenza A By PCR POSITIVE (*)    All other components within normal limits    EKG None  Radiology Dg Chest 2 View  Result Date: 01/23/2018 CLINICAL DATA:  Fever since Monday night, scratchy throat, dry cough, fever 103.0 degrees EXAM: CHEST - 2 VIEW COMPARISON:  02/14/2006 FINDINGS: Normal heart size, mediastinal contours, and pulmonary vascularity. Lungs clear. No pulmonary infiltrate, pleural effusion or pneumothorax. Bones unremarkable. IMPRESSION: No acute abnormalities. Electronically Signed   By: Ulyses SouthwardMark  Boles M.D.   On: 01/23/2018 16:43    Procedures Procedures (including critical care time)  Medications Ordered in ED Medications  acetaminophen (TYLENOL) tablet 1,000 mg (1,000 mg Oral Given 01/23/18 1652)  oseltamivir (TAMIFLU) capsule 75 mg (75 mg Oral Given 01/23/18 1750)     Initial Impression / Assessment and Plan / ED Course  I have reviewed the triage vital signs and the nursing notes.  Pertinent labs & imaging results that were available during my care of the patient were reviewed by me and considered in my medical decision making (see chart for details).     Patient in no distress on repeat exam. I discussed lab  findings with him and multiple family members.  We discussed the importance of precautions for family members who are not immunized, as well as follow-up as needed. With no evidence for bacteremia, sepsis, but with positive influenza result, the patient will start Tamiflu, be discharged in stable condition.  Final Clinical Impressions(s) / ED Diagnoses   Final diagnoses:  Influenza    ED Discharge Orders         Ordered    oseltamivir (TAMIFLU) 75 MG capsule  Every 12 hours     01/23/18 1750           Gerhard MunchLockwood, Mckenzy Salazar, MD 01/23/18 1815

## 2018-01-23 NOTE — ED Triage Notes (Signed)
Pt states that he has had a fever since Monday night. Pt states he started having a scratchy throat, dry cough, and fever at that time. Pt states that he has tried robotussin and ibuprofen without relief. Pt states fever at home of 103. Pt states robutussin at 1230 without relief.

## 2018-03-13 DIAGNOSIS — K5730 Diverticulosis of large intestine without perforation or abscess without bleeding: Secondary | ICD-10-CM | POA: Diagnosis not present

## 2018-03-13 DIAGNOSIS — K635 Polyp of colon: Secondary | ICD-10-CM | POA: Diagnosis not present

## 2018-03-13 DIAGNOSIS — Z1211 Encounter for screening for malignant neoplasm of colon: Secondary | ICD-10-CM | POA: Diagnosis not present

## 2018-06-10 DIAGNOSIS — R1011 Right upper quadrant pain: Secondary | ICD-10-CM | POA: Diagnosis not present

## 2020-01-22 ENCOUNTER — Other Ambulatory Visit: Payer: Self-pay | Admitting: Family Medicine

## 2020-01-22 ENCOUNTER — Ambulatory Visit: Payer: Self-pay

## 2020-01-22 ENCOUNTER — Other Ambulatory Visit: Payer: Self-pay

## 2020-01-22 DIAGNOSIS — M79644 Pain in right finger(s): Secondary | ICD-10-CM

## 2020-03-19 ENCOUNTER — Other Ambulatory Visit: Payer: Self-pay | Admitting: Orthopedic Surgery

## 2020-03-19 DIAGNOSIS — M79644 Pain in right finger(s): Secondary | ICD-10-CM

## 2020-04-14 ENCOUNTER — Other Ambulatory Visit: Payer: 59

## 2020-04-14 ENCOUNTER — Inpatient Hospital Stay: Admission: RE | Admit: 2020-04-14 | Payer: 59 | Source: Ambulatory Visit

## 2020-09-29 ENCOUNTER — Ambulatory Visit
Admission: RE | Admit: 2020-09-29 | Discharge: 2020-09-29 | Disposition: A | Discharge status: Home / Self Care | Payer: 59 | Source: Ambulatory Visit | Attending: Physician Assistant | Admitting: Physician Assistant

## 2020-09-29 ENCOUNTER — Other Ambulatory Visit: Payer: Self-pay | Admitting: Physician Assistant

## 2020-09-29 DIAGNOSIS — R059 Cough, unspecified: Secondary | ICD-10-CM

## 2022-11-12 IMAGING — DX DG FINGER INDEX 2+V*R*
4 series · 4 of 4 positions shown · non-contrast
Comparison: None.

CLINICAL DATA: Hyperextension injury of the right index finger

EXAM:
RIGHT INDEX FINGER 2+V

[finger pa]
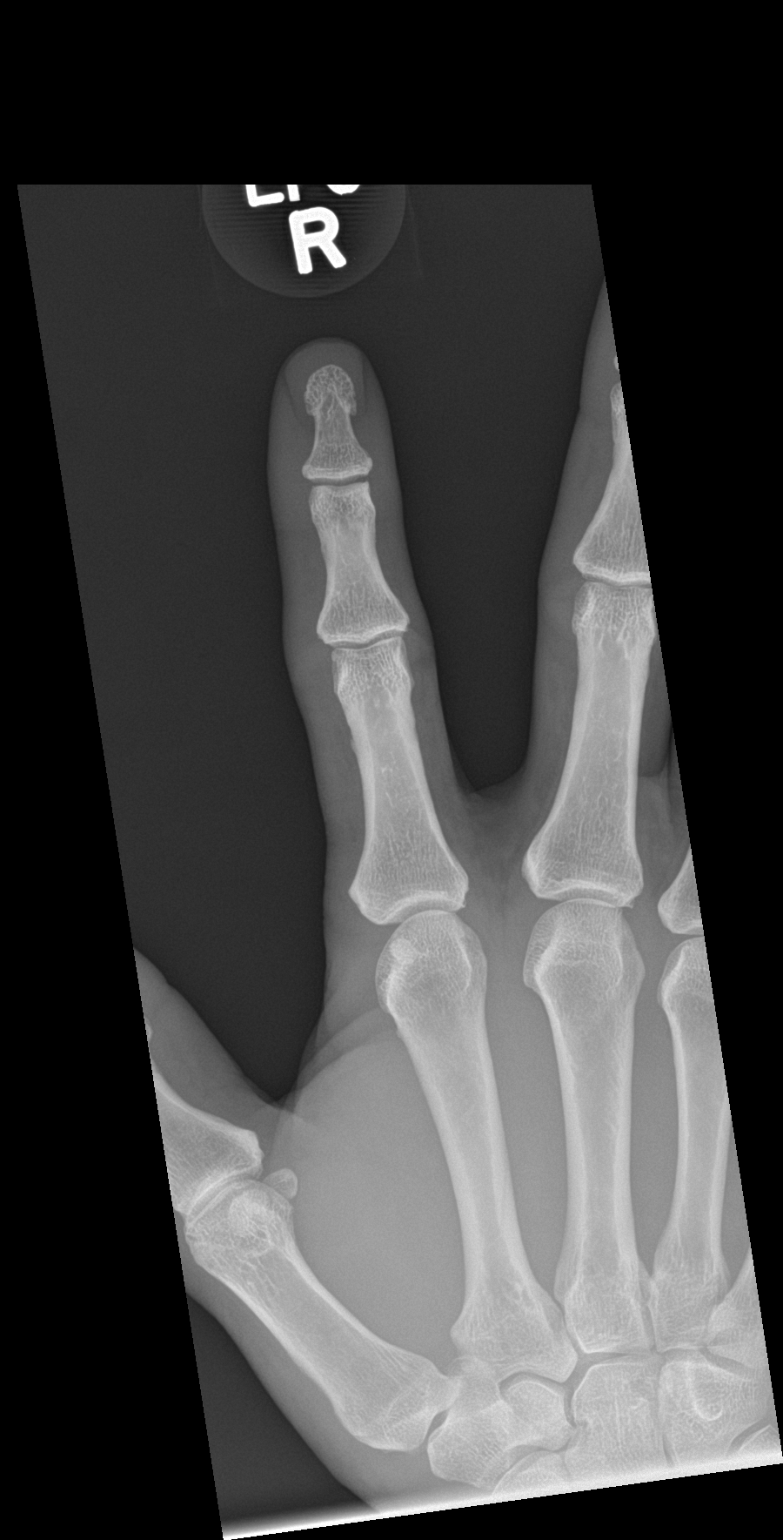

[finger obl (1 of 2)]
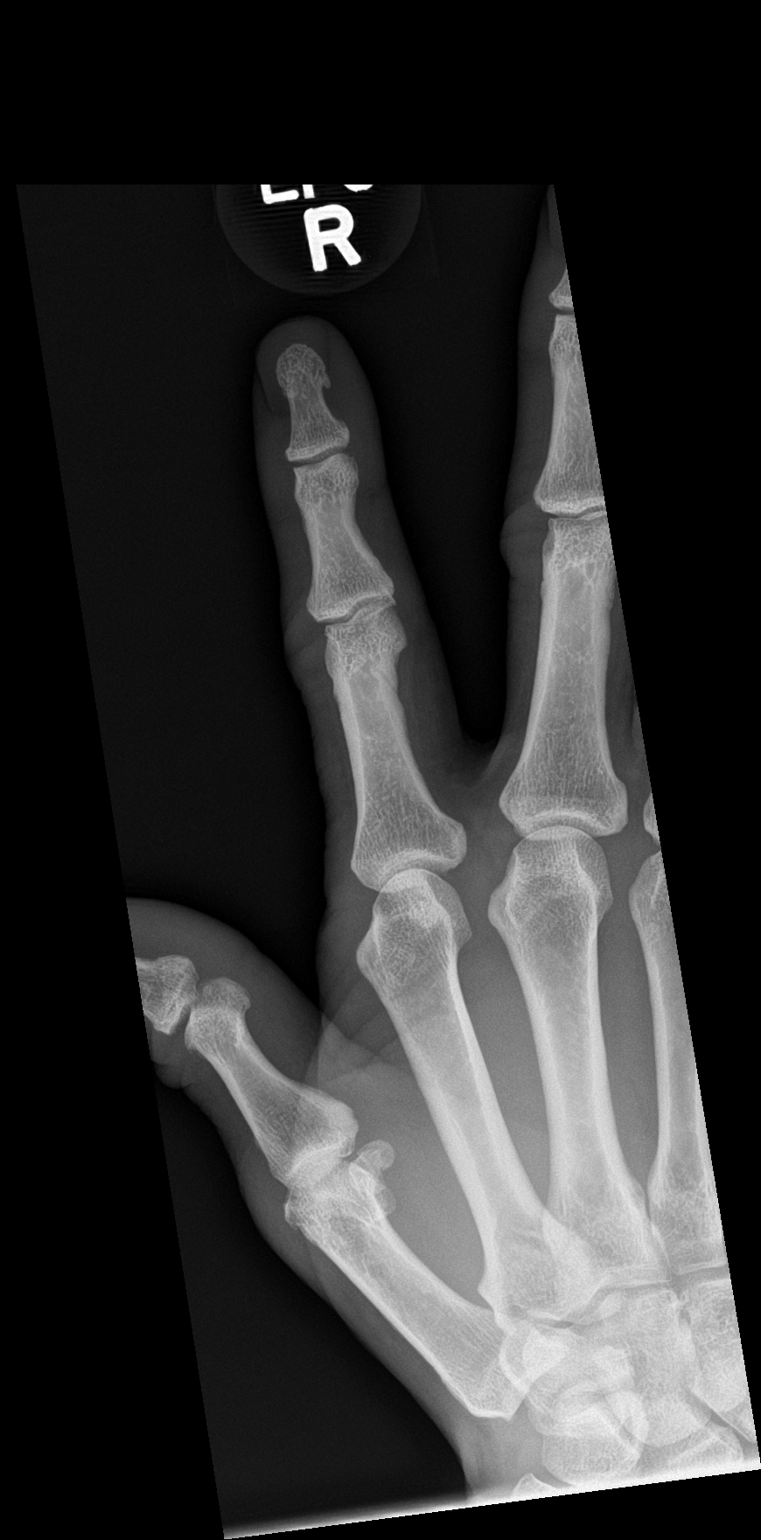

[finger lat]
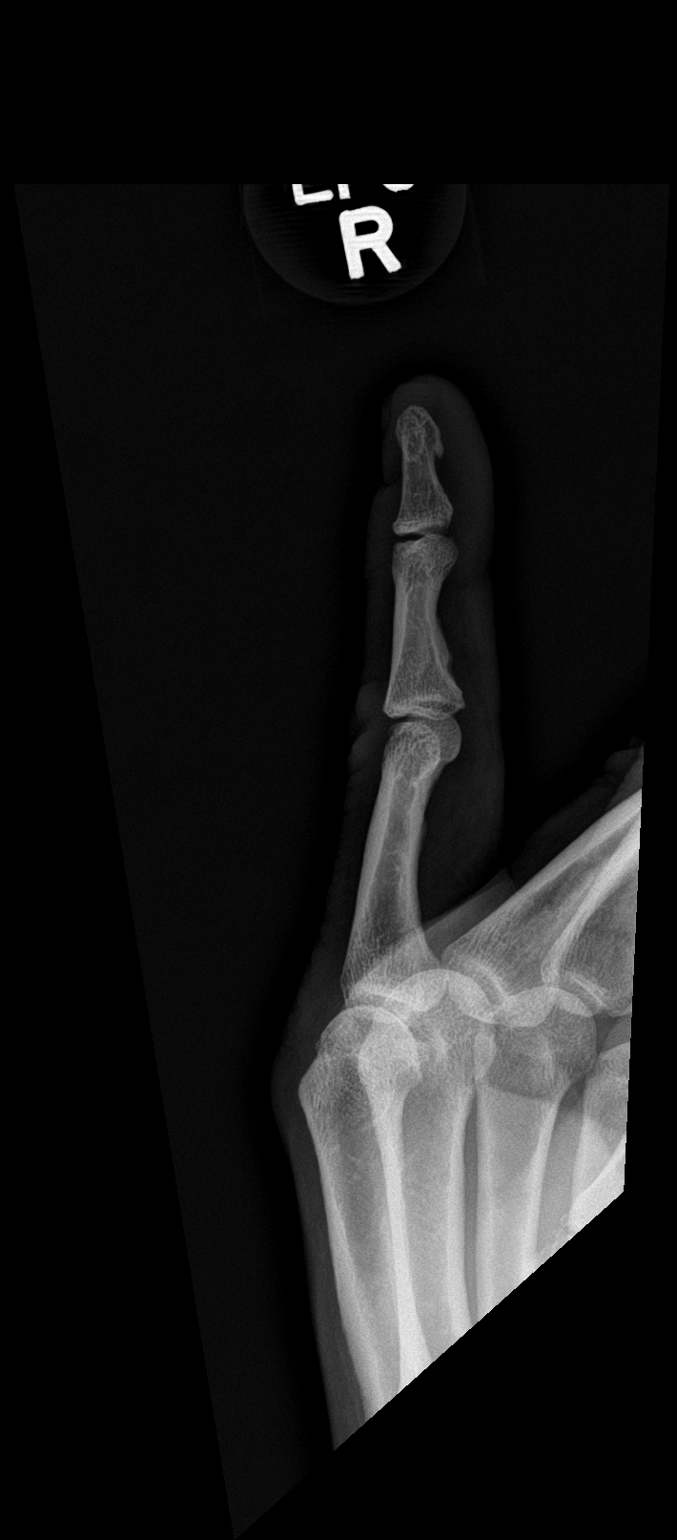

[finger obl (2 of 2)]
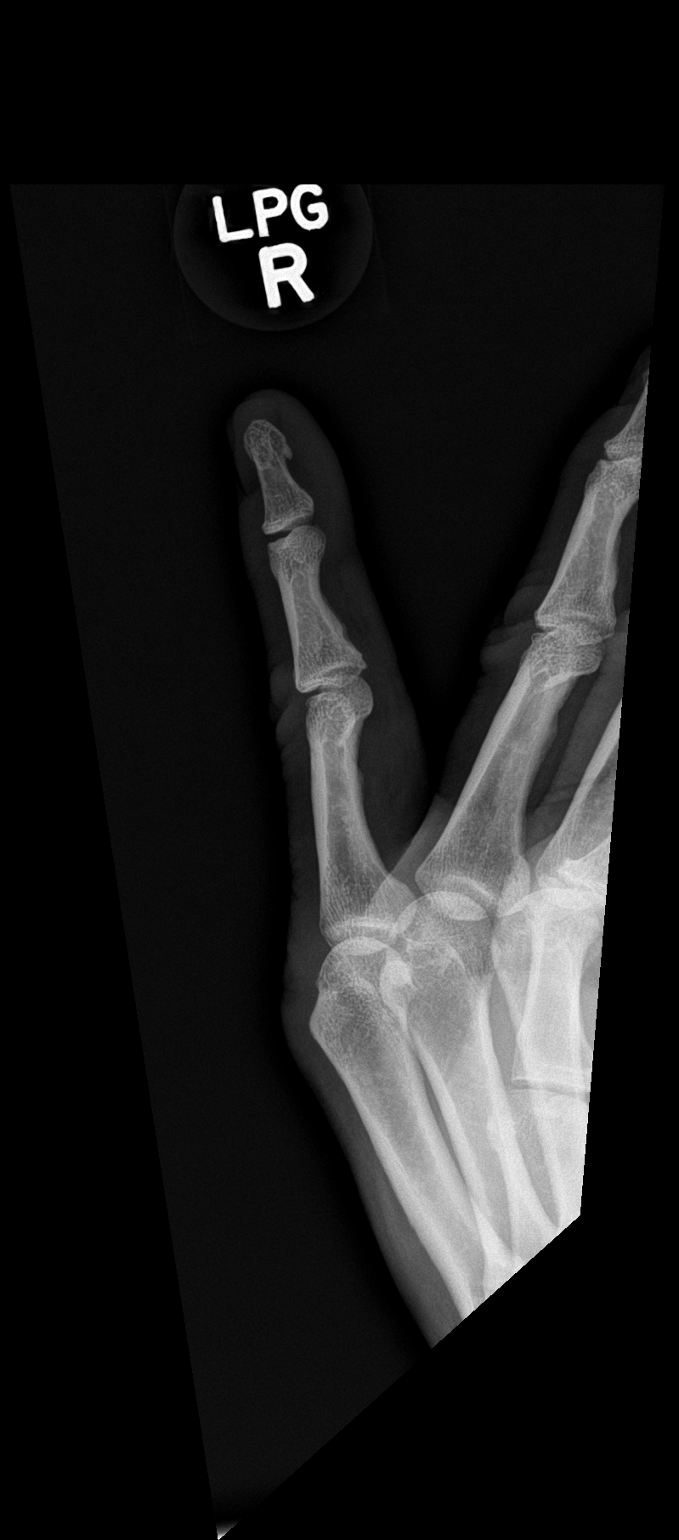

[4 of 4 positions shown; findings below may reference images not displayed]

FINDINGS: Slightly suboptimal positioning on lateral view. There is no
evidence of fracture or dislocation. There is no evidence of
arthropathy or other focal bone abnormality. Soft tissues are
unremarkable.
IMPRESSION: Negative.

## 2024-02-28 ENCOUNTER — Other Ambulatory Visit: Payer: Self-pay | Admitting: Nurse Practitioner

## 2024-02-28 ENCOUNTER — Telehealth: Payer: Self-pay

## 2024-02-28 ENCOUNTER — Ambulatory Visit
Admission: RE | Admit: 2024-02-28 | Discharge: 2024-02-28 | Disposition: A | Discharge status: Home / Self Care | Source: Ambulatory Visit | Attending: Nurse Practitioner | Admitting: Nurse Practitioner

## 2024-02-28 DIAGNOSIS — S6992XA Unspecified injury of left wrist, hand and finger(s), initial encounter: Secondary | ICD-10-CM
